# Patient Record
Sex: Female | Born: 1975 | Race: Black or African American | Hispanic: No | Marital: Married | Smoking: Never smoker
Health system: Southern US, Community
[De-identification: ages and names within clinical notes are randomized; demographics above are authoritative.]

---

## 1998-02-11 ENCOUNTER — Inpatient Hospital Stay (HOSPITAL_COMMUNITY): Admission: AD | Admit: 1998-02-11 | Discharge: 1998-02-13 | Payer: Self-pay | Admitting: Obstetrics and Gynecology

## 1998-03-23 ENCOUNTER — Encounter: Admission: RE | Admit: 1998-03-23 | Discharge: 1998-06-21 | Payer: Self-pay | Admitting: Obstetrics and Gynecology

## 1998-03-27 ENCOUNTER — Other Ambulatory Visit: Admission: RE | Admit: 1998-03-27 | Discharge: 1998-03-27 | Payer: Self-pay | Admitting: Obstetrics and Gynecology

## 1998-06-26 ENCOUNTER — Encounter (HOSPITAL_COMMUNITY): Admission: RE | Admit: 1998-06-26 | Discharge: 1998-09-24 | Payer: Self-pay | Admitting: *Deleted

## 1998-10-22 ENCOUNTER — Encounter (HOSPITAL_COMMUNITY): Admission: RE | Admit: 1998-10-22 | Discharge: 1999-01-20 | Payer: Self-pay | Admitting: *Deleted

## 1998-10-27 ENCOUNTER — Other Ambulatory Visit: Admission: RE | Admit: 1998-10-27 | Discharge: 1998-10-27 | Payer: Self-pay | Admitting: Obstetrics and Gynecology

## 1999-01-24 ENCOUNTER — Encounter (HOSPITAL_COMMUNITY): Admission: RE | Admit: 1999-01-24 | Discharge: 1999-04-24 | Payer: Self-pay | Admitting: *Deleted

## 1999-04-18 ENCOUNTER — Other Ambulatory Visit: Admission: RE | Admit: 1999-04-18 | Discharge: 1999-04-18 | Payer: Self-pay | Admitting: Obstetrics and Gynecology

## 2000-05-22 ENCOUNTER — Emergency Department (HOSPITAL_COMMUNITY): Admission: EM | Admit: 2000-05-22 | Discharge: 2000-05-22 | Payer: Self-pay | Admitting: Emergency Medicine

## 2000-11-07 ENCOUNTER — Other Ambulatory Visit: Admission: RE | Admit: 2000-11-07 | Discharge: 2000-11-07 | Payer: Self-pay | Admitting: Obstetrics and Gynecology

## 2001-11-17 ENCOUNTER — Other Ambulatory Visit: Admission: RE | Admit: 2001-11-17 | Discharge: 2001-11-17 | Payer: Self-pay | Admitting: Obstetrics and Gynecology

## 2003-02-10 ENCOUNTER — Other Ambulatory Visit: Admission: RE | Admit: 2003-02-10 | Discharge: 2003-02-10 | Payer: Self-pay | Admitting: Obstetrics and Gynecology

## 2003-05-20 ENCOUNTER — Ambulatory Visit (HOSPITAL_COMMUNITY): Admission: RE | Admit: 2003-05-20 | Discharge: 2003-05-20 | Payer: Self-pay | Admitting: Internal Medicine

## 2003-05-20 ENCOUNTER — Encounter: Payer: Self-pay | Admitting: Internal Medicine

## 2003-05-26 ENCOUNTER — Encounter: Admission: RE | Admit: 2003-05-26 | Discharge: 2003-08-24 | Payer: Self-pay | Admitting: Family Medicine

## 2004-02-14 ENCOUNTER — Other Ambulatory Visit: Admission: RE | Admit: 2004-02-14 | Discharge: 2004-02-14 | Payer: Self-pay | Admitting: Obstetrics and Gynecology

## 2005-05-01 ENCOUNTER — Other Ambulatory Visit: Admission: RE | Admit: 2005-05-01 | Discharge: 2005-05-01 | Payer: Self-pay | Admitting: Obstetrics and Gynecology

## 2005-07-02 ENCOUNTER — Ambulatory Visit: Payer: Self-pay | Admitting: Internal Medicine

## 2005-07-19 ENCOUNTER — Ambulatory Visit (HOSPITAL_COMMUNITY): Admission: RE | Admit: 2005-07-19 | Discharge: 2005-07-19 | Payer: Self-pay | Admitting: Internal Medicine

## 2005-07-19 ENCOUNTER — Ambulatory Visit: Payer: Self-pay | Admitting: Internal Medicine

## 2006-11-04 ENCOUNTER — Encounter: Admission: RE | Admit: 2006-11-04 | Discharge: 2006-11-04 | Payer: Self-pay | Admitting: *Deleted

## 2008-07-24 ENCOUNTER — Emergency Department (HOSPITAL_COMMUNITY): Admission: EM | Admit: 2008-07-24 | Discharge: 2008-07-25 | Payer: Self-pay | Admitting: Emergency Medicine

## 2008-10-24 ENCOUNTER — Inpatient Hospital Stay (HOSPITAL_COMMUNITY): Admission: AD | Admit: 2008-10-24 | Discharge: 2008-10-24 | Payer: Self-pay | Admitting: Obstetrics and Gynecology

## 2008-10-27 ENCOUNTER — Ambulatory Visit: Admission: RE | Admit: 2008-10-27 | Discharge: 2008-10-27 | Payer: Self-pay | Admitting: Obstetrics and Gynecology

## 2008-10-27 ENCOUNTER — Encounter (HOSPITAL_COMMUNITY): Payer: Self-pay | Admitting: Obstetrics and Gynecology

## 2008-10-27 ENCOUNTER — Ambulatory Visit: Payer: Self-pay | Admitting: Vascular Surgery

## 2008-12-28 ENCOUNTER — Inpatient Hospital Stay (HOSPITAL_COMMUNITY): Admission: AD | Admit: 2008-12-28 | Discharge: 2008-12-31 | Payer: Self-pay | Admitting: Obstetrics and Gynecology

## 2010-03-30 ENCOUNTER — Ambulatory Visit (HOSPITAL_COMMUNITY): Admission: RE | Admit: 2010-03-30 | Discharge: 2010-03-30 | Payer: Self-pay | Admitting: Obstetrics and Gynecology

## 2010-11-05 LAB — CBC
HCT: 30.5 % — ABNORMAL LOW (ref 36.0–46.0)
HCT: 35.6 % — ABNORMAL LOW (ref 36.0–46.0)
Hemoglobin: 10.9 g/dL — ABNORMAL LOW (ref 12.0–15.0)
Hemoglobin: 12.4 g/dL (ref 12.0–15.0)
MCHC: 34.8 g/dL (ref 30.0–36.0)
MCHC: 35.5 g/dL (ref 30.0–36.0)
MCV: 92.5 fL (ref 78.0–100.0)
MCV: 92.7 fL (ref 78.0–100.0)
Platelets: 244 10*3/uL (ref 150–400)
Platelets: 246 10*3/uL (ref 150–400)
RBC: 3.3 MIL/uL — ABNORMAL LOW (ref 3.87–5.11)
RBC: 3.84 MIL/uL — ABNORMAL LOW (ref 3.87–5.11)
RDW: 14.9 % (ref 11.5–15.5)
RDW: 14.9 % (ref 11.5–15.5)
WBC: 10.3 10*3/uL (ref 4.0–10.5)
WBC: 10.9 10*3/uL — ABNORMAL HIGH (ref 4.0–10.5)

## 2010-11-05 LAB — RPR: RPR Ser Ql: NONREACTIVE

## 2010-11-08 LAB — CBC
HCT: 34.3 % — ABNORMAL LOW (ref 36.0–46.0)
Hemoglobin: 11.8 g/dL — ABNORMAL LOW (ref 12.0–15.0)
MCHC: 34.3 g/dL (ref 30.0–36.0)
Platelets: 215 10*3/uL (ref 150–400)
RDW: 14.3 % (ref 11.5–15.5)

## 2010-11-08 LAB — COMPREHENSIVE METABOLIC PANEL
ALT: 16 U/L (ref 0–35)
AST: 23 U/L (ref 0–37)
Albumin: 2.7 g/dL — ABNORMAL LOW (ref 3.5–5.2)
Alkaline Phosphatase: 57 U/L (ref 39–117)
CO2: 21 mEq/L (ref 19–32)
Chloride: 103 mEq/L (ref 96–112)
Creatinine, Ser: 0.47 mg/dL (ref 0.4–1.2)
GFR calc non Af Amer: >60 mL/min (ref >60)
Potassium: 4 mEq/L (ref 3.5–5.1)
Sodium: 132 mEq/L — ABNORMAL LOW (ref 135–145)
Total Bilirubin: 0.3 mg/dL (ref 0.3–1.2)

## 2010-11-08 LAB — URINALYSIS, ROUTINE W REFLEX MICROSCOPIC
Bilirubin Urine: NEGATIVE
Glucose, UA: NEGATIVE mg/dL
Ketones, ur: NEGATIVE mg/dL
Nitrite: NEGATIVE
Protein, ur: NEGATIVE mg/dL
pH: 6.5 (ref 5.0–8.0)

## 2011-03-06 ENCOUNTER — Emergency Department (HOSPITAL_COMMUNITY)
Admission: EM | Admit: 2011-03-06 | Discharge: 2011-03-06 | Disposition: A | Discharge status: Home / Self Care | Payer: PRIVATE HEALTH INSURANCE | Attending: Emergency Medicine | Admitting: Emergency Medicine

## 2011-03-06 DIAGNOSIS — K089 Disorder of teeth and supporting structures, unspecified: Secondary | ICD-10-CM | POA: Insufficient documentation

## 2011-03-06 DIAGNOSIS — K029 Dental caries, unspecified: Secondary | ICD-10-CM | POA: Insufficient documentation

## 2011-03-15 IMAGING — RF DG HYSTEROGRAM
6 series · 6 of 6 positions shown · IV contrast (omnipaque)
Comparison: none

CLINICAL DATA: Post Essure evaluation

HYSTEROSALPINGOGRAM
TECHNIQUE: Following cleansing of the cervix and vagina with
Betadine solution, a hysterosalpingogram was performed using a 5-
French hysterosalpingogram catheter and Omnipaque 300 contrast.
The patient tolerated the examination without difficulty.
Fluoroscopy time: 0.9 minutes.

[Series 1: run · 1 of 1 slices shown (1 of 6)]
[im 1/1]
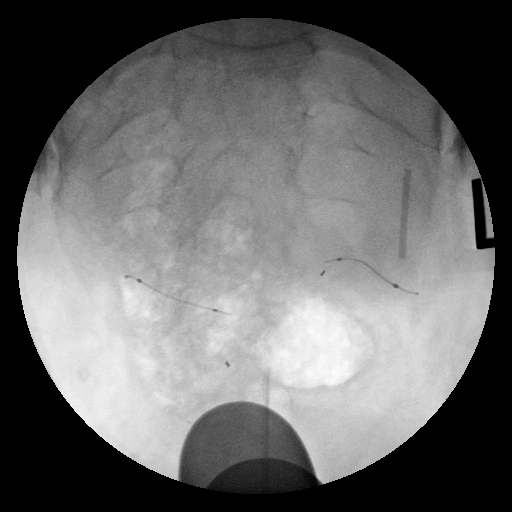

[Series 2: run · 1 of 1 slices shown (2 of 6)]
[im 1/1]
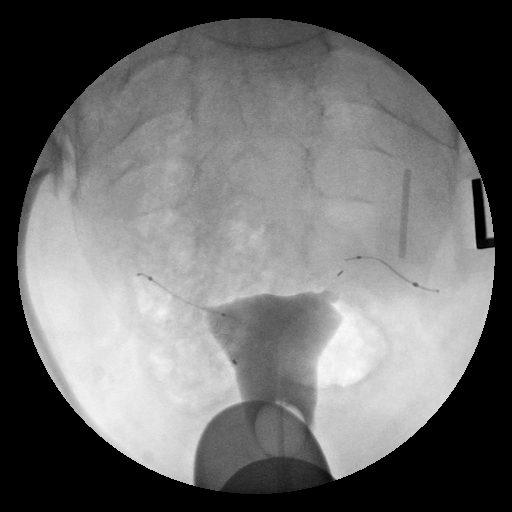

[Series 3: run · 1 of 1 slices shown (3 of 6)]
[im 1/1]
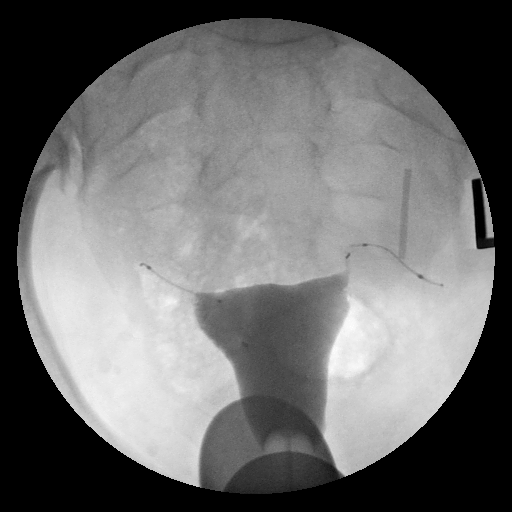

[Series 4: run · 1 of 1 slices shown (4 of 6)]
[im 1/1]
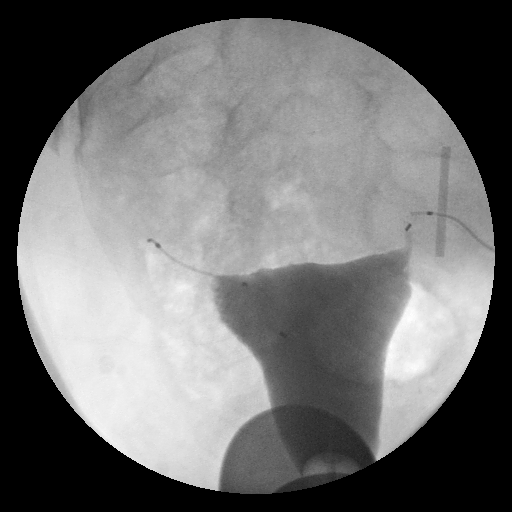

[Series 5: run · 1 of 1 slices shown (5 of 6)]
[im 1/1]
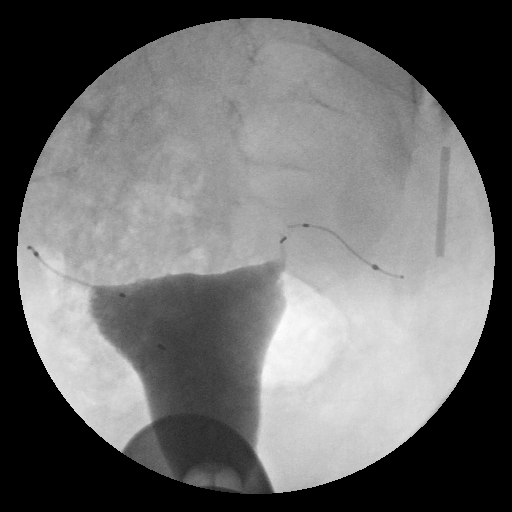

[Series 6: run · 1 of 1 slices shown (6 of 6)]
[im 1/1]
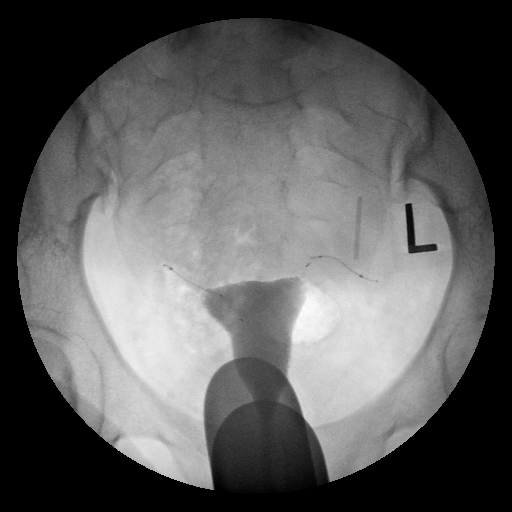

[6 of 6 positions shown; findings below may reference images not displayed]

FINDINGS: The endometrial morphology is within normal limits and
slightly patulous compatible with the multiparous status.

Both Essure implants are identified in appropriate position with
the right implant having a trailing edge into the uterine cavity
and the left implant with the proximal portion located just beyond
the cornual-tubal junction.  No evidence for contrast is seen
beyond the cornual-tubal junction on either side compatible
radiographically with tubal occlusion.
IMPRESSION: Appropriate implant positioning bilaterally.  Bilateral tubal
occlusion noted.

## 2013-06-03 ENCOUNTER — Encounter: Payer: Self-pay | Admitting: Podiatry

## 2013-06-03 ENCOUNTER — Ambulatory Visit (INDEPENDENT_AMBULATORY_CARE_PROVIDER_SITE_OTHER): Payer: BC Managed Care – PPO

## 2013-06-03 ENCOUNTER — Ambulatory Visit (INDEPENDENT_AMBULATORY_CARE_PROVIDER_SITE_OTHER): Payer: BC Managed Care – PPO | Admitting: Podiatry

## 2013-06-03 VITALS — Ht 70.0 in | Wt 253.0 lb

## 2013-06-03 DIAGNOSIS — R52 Pain, unspecified: Secondary | ICD-10-CM

## 2013-06-03 DIAGNOSIS — M7750 Other enthesopathy of unspecified foot: Secondary | ICD-10-CM

## 2013-06-03 MED ORDER — TRIAMCINOLONE ACETONIDE 10 MG/ML IJ SUSP
5.0000 mg | Freq: Once | INTRAMUSCULAR | Status: AC
  Administered 2013-06-03: 5 mg via INTRA_ARTICULAR

## 2013-06-03 NOTE — Progress Notes (Signed)
Subjective:     Patient ID: Kristin Gutierrez, female   DOB: 06-09-1976, 37 y.o.   MRN: [id number]  Foot Pain   patient presents stating I am having a lot of pain in the outside of my right foot. States it has been present for about 6 months and gets sore with ambulation   Review of Systems  All other systems reviewed and are negative.       Objective:   Physical Exam  Constitutional: She is oriented to person, place, and time. She appears well-developed.  Cardiovascular: Intact distal pulses.   Musculoskeletal: Normal range of motion.  Neurological: She is oriented to person, place, and time.  Skin: Skin is warm.   neurovascular status intact and muscle strength found to be adequate. At the peroneal brevis insertion there is exquisite discomfort when palpated to this area     Assessment:     Tendinitis right peroneal brevis insertion    Plan:     H&P and x-ray reviewed with patient. Sheath injection 3 mg Kenalog 5 mg Xylocaine Marcaine mixture accomplished and fascial brace dispensed with instructions

## 2013-06-03 NOTE — Progress Notes (Signed)
N- SORE, SWOLLEN  L- RT FOOT D SINCE MAY O SLOWLY C WORSE A WALKING T N/A

## 2013-06-14 ENCOUNTER — Encounter: Payer: Self-pay | Admitting: Podiatry

## 2013-06-14 ENCOUNTER — Ambulatory Visit: Payer: BC Managed Care – PPO | Admitting: Podiatry

## 2013-06-14 ENCOUNTER — Ambulatory Visit (INDEPENDENT_AMBULATORY_CARE_PROVIDER_SITE_OTHER): Payer: BC Managed Care – PPO | Admitting: Podiatry

## 2013-06-14 VITALS — BP 125/77 | HR 70 | Resp 16 | Ht 69.0 in | Wt 253.0 lb

## 2013-06-14 DIAGNOSIS — M7750 Other enthesopathy of unspecified foot: Secondary | ICD-10-CM

## 2013-06-16 NOTE — Progress Notes (Signed)
Subjective:     Patient ID: Kristin Gutierrez, female   DOB: Mar 14, 1976, 36 y.o.   MRN: [id number]  HPI patient states it's feeling some better but still mild discomfort noted   Review of Systems     Objective:   Physical Exam upon palpation the pain has reduced by about 80% lateral side right foot    neurovascular status intact with patient found to be well oriented Assessment:     Improved tendinitis right    Plan:     Advised on physical therapy supportive shoe gear and ice if symptoms recur reappoint

## 2013-08-24 ENCOUNTER — Other Ambulatory Visit: Payer: Self-pay | Admitting: Gastroenterology

## 2013-08-24 DIAGNOSIS — R110 Nausea: Secondary | ICD-10-CM

## 2013-08-27 ENCOUNTER — Ambulatory Visit
Admission: RE | Admit: 2013-08-27 | Discharge: 2013-08-27 | Disposition: A | Discharge status: Home / Self Care | Payer: BC Managed Care – PPO | Source: Ambulatory Visit | Attending: Gastroenterology | Admitting: Gastroenterology

## 2013-08-27 DIAGNOSIS — R110 Nausea: Secondary | ICD-10-CM

## 2013-09-30 ENCOUNTER — Encounter: Payer: Self-pay | Admitting: Podiatry

## 2013-09-30 ENCOUNTER — Ambulatory Visit (INDEPENDENT_AMBULATORY_CARE_PROVIDER_SITE_OTHER): Payer: BC Managed Care – PPO | Admitting: Podiatry

## 2013-09-30 VITALS — BP 127/82 | HR 87 | Resp 16

## 2013-09-30 DIAGNOSIS — M7750 Other enthesopathy of unspecified foot: Secondary | ICD-10-CM

## 2013-09-30 MED ORDER — TRIAMCINOLONE ACETONIDE 10 MG/ML IJ SUSP
10.0000 mg | Freq: Once | INTRAMUSCULAR | Status: AC
  Administered 2013-09-30: 10 mg

## 2013-09-30 NOTE — Progress Notes (Signed)
Subjective:     Patient ID: Kristin Gutierrez, female   DOB: 08/25/1975, 38 y.o.   MRN: [id number]  HPI patient presents stating I'm having pain in the outside of my right foot that has returned in the last few weeks. I am and nerves and need to be active and it is becoming increasingly hard for me to do with the return of this pain   Review of Systems     Objective:   Physical Exam Neurovascular status intact with patient well oriented x3 and discomfort in the peroneal tendon as it inserts into the base of the fifth metatarsal right with mild edema around the area but no indications of current tear of the tendon and tendon found to be strong when tested from a muscle strength perspective    Assessment:     Peroneal brevis tendon inflammation right with no current indication of tear    Plan:     Explained to the patient this issue and at this time the careful sheath injection 3 mg Kenalog 5 mg Xylocaine Marcaine mixture and applied air fracture walker to immobilize the lateral tendon. Then went ahead and scanned for custom orthotics with valgus wedges to try to reduce lateral stress

## 2013-10-05 ENCOUNTER — Telehealth: Payer: Self-pay | Admitting: *Deleted

## 2013-10-05 NOTE — Telephone Encounter (Signed)
Pt states she left a message at 829am this morning, I did get a message, the pt spoke quickly and was difficult to understand.  I made multiple attempts at deciphering the name and date of birth and phone number, but it was unclear.  I was able to understand she had nerve pain in her big toe due to the "air boot".  Pt called again and gave the time she called and I was able to identify pt by the time initially called and the problem listed.  I left a message instructedpt to release the air in the Air Fracture Walker by turning the knob beneath the gray bubble counter-clockwise, then reinflate by turning the knob clockwise and pumping the bubble up until snug, but not tight.  I also instructed the pt to cross the last 2 straps and that changes the pressure the boot put on the toes.  Pt left a message states is having numbness in the top of her foot that goes into her toe.  I called pt, she states the original problem is better in the boot, but could she be doing something with the boot that is causing the new problem.  I told pt to try the tips I gave and call tomorrow and I would see her if she needed.  Pt agreed.

## 2013-10-14 ENCOUNTER — Other Ambulatory Visit: Payer: BC Managed Care – PPO

## 2013-10-18 ENCOUNTER — Ambulatory Visit (INDEPENDENT_AMBULATORY_CARE_PROVIDER_SITE_OTHER): Payer: BC Managed Care – PPO | Admitting: *Deleted

## 2013-10-18 DIAGNOSIS — R52 Pain, unspecified: Secondary | ICD-10-CM

## 2013-10-18 NOTE — Progress Notes (Signed)
   Subjective:    Patient ID: Kristin Gutierrez, female    DOB: 02/11/1976, 38 y.o.   MRN: [id number]  HPI RE-SCANNED FOR ORTHOTICS    Review of Systems     Objective:   Physical Exam        Assessment & Plan:

## 2013-11-08 ENCOUNTER — Telehealth: Payer: Self-pay | Admitting: *Deleted

## 2013-11-08 ENCOUNTER — Other Ambulatory Visit: Payer: BC Managed Care – PPO

## 2013-11-08 NOTE — Telephone Encounter (Signed)
Patient came by to pick up her orthotics, instructions were given.

## 2013-12-27 ENCOUNTER — Ambulatory Visit: Payer: BC Managed Care – PPO | Admitting: Podiatry

## 2014-08-12 IMAGING — US US ABDOMEN COMPLETE
1 series · 14 of 25 positions shown · non-contrast
Comparison: None.

CLINICAL DATA: Nausea with diarrhea for 3 weeks. History of Essure
implant.

EXAM:
ULTRASOUND ABDOMEN COMPLETE

[Series 1: us abdomen complete · 0.39mm/px · 14 of 71 slices shown]
[im 1/71]
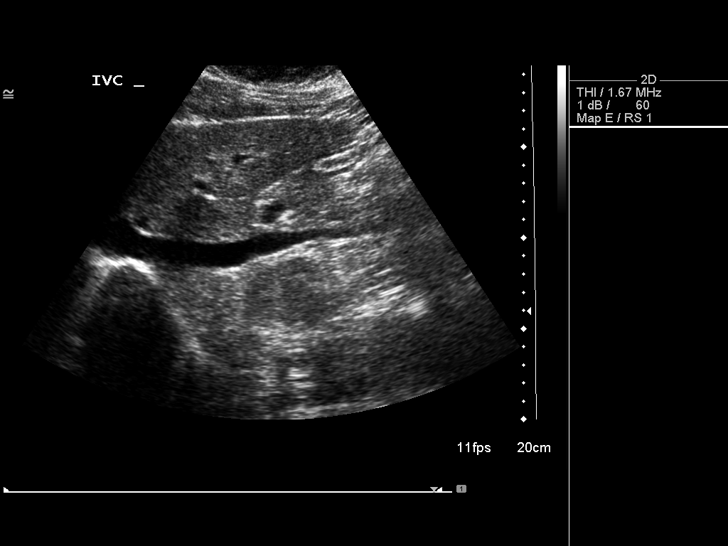
[im 6/71]
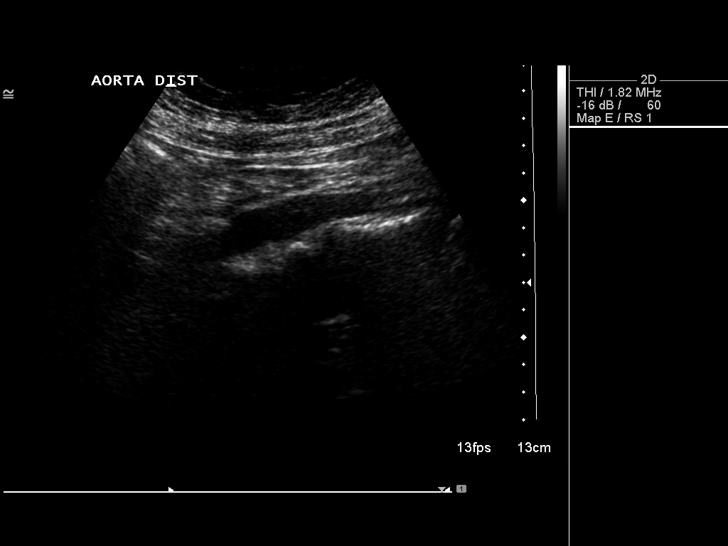
[im 12/71]
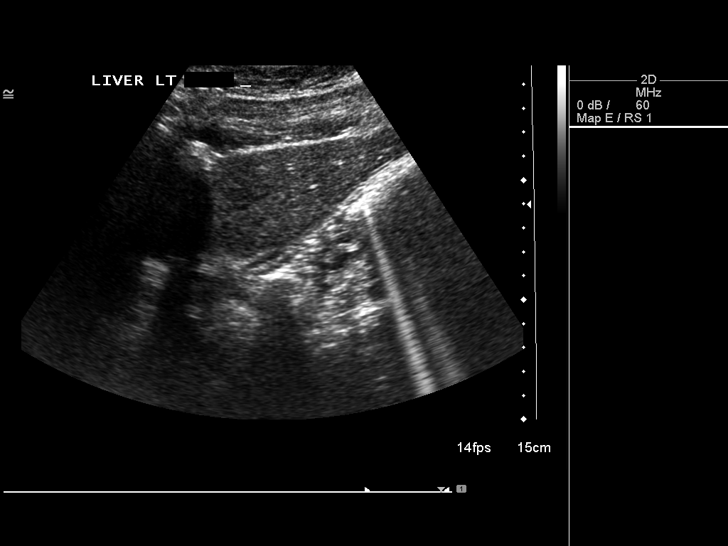
[im 18/71]
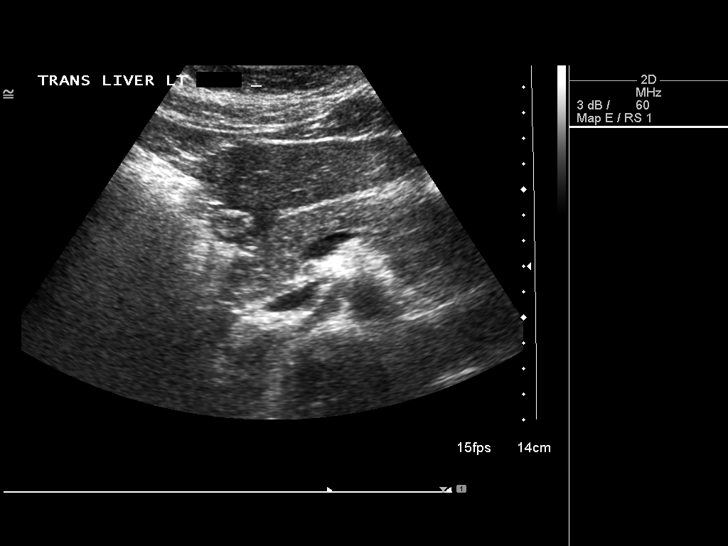
[im 24/71]
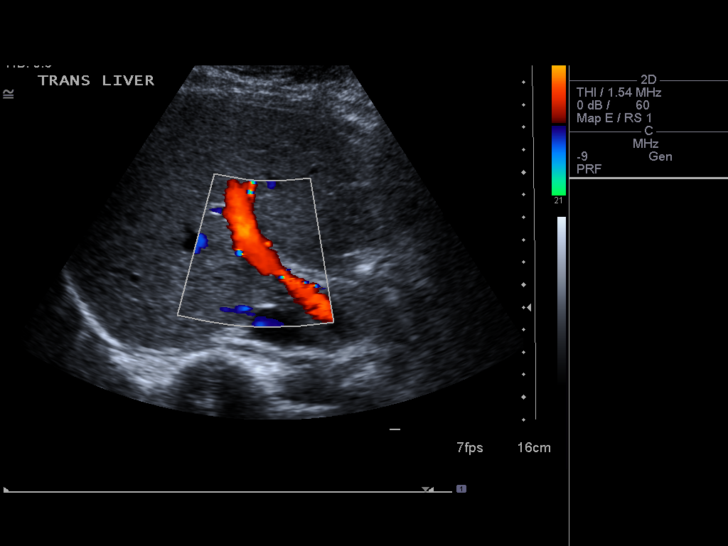
[im 27/71]
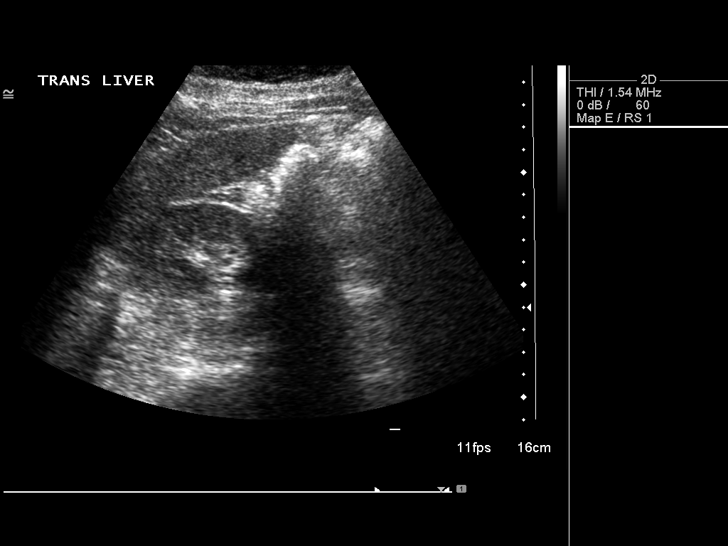
[im 33/71]
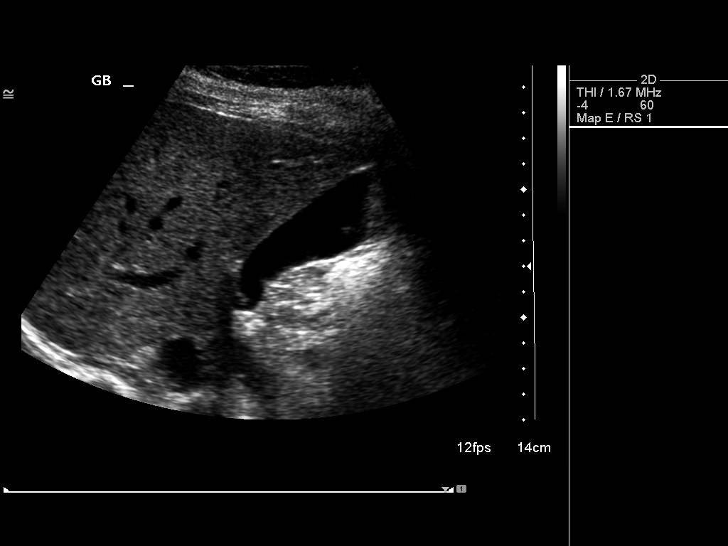
[im 38/71]
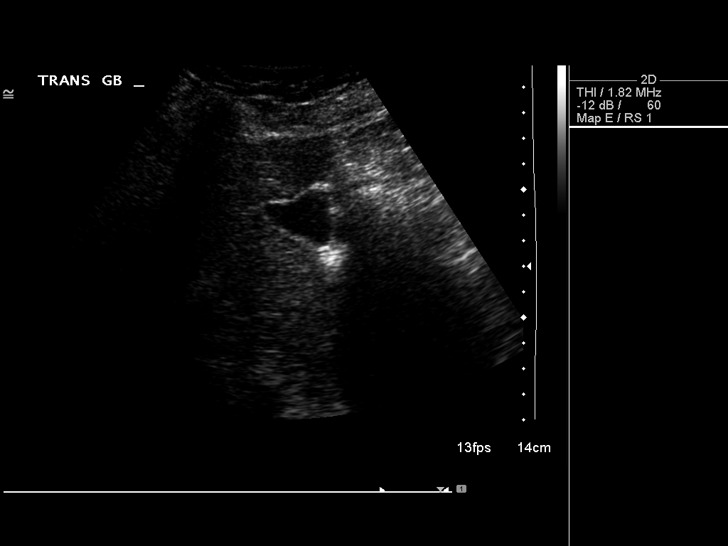
[im 44/71]
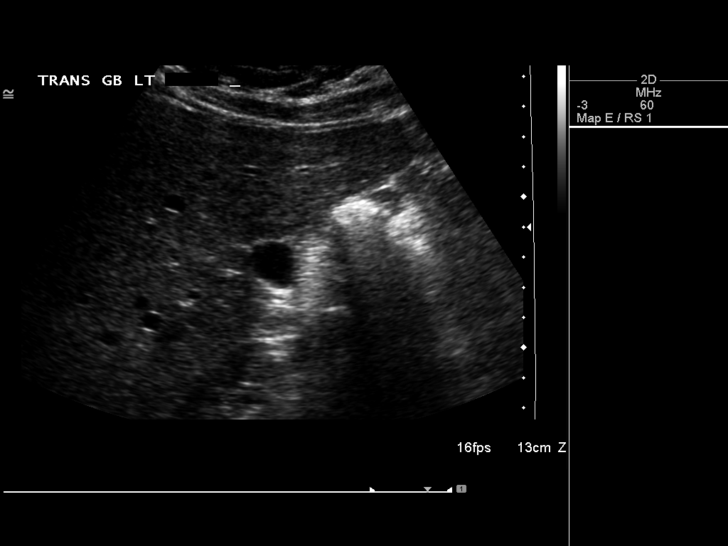
[im 47/71]
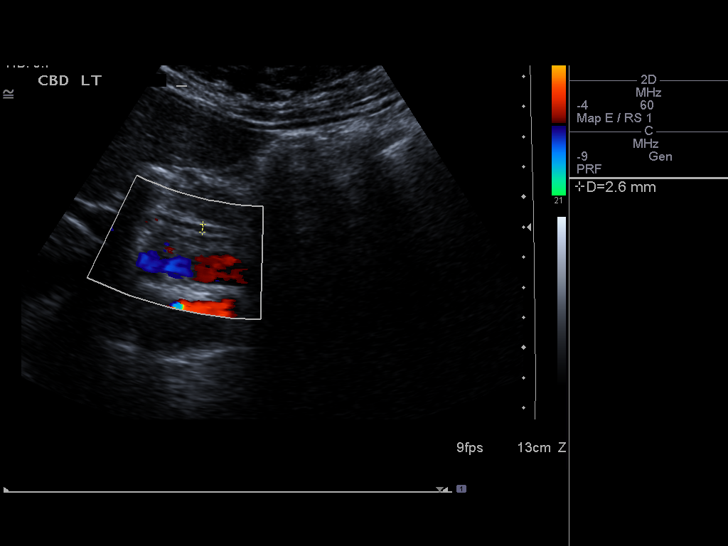
[im 53/71]
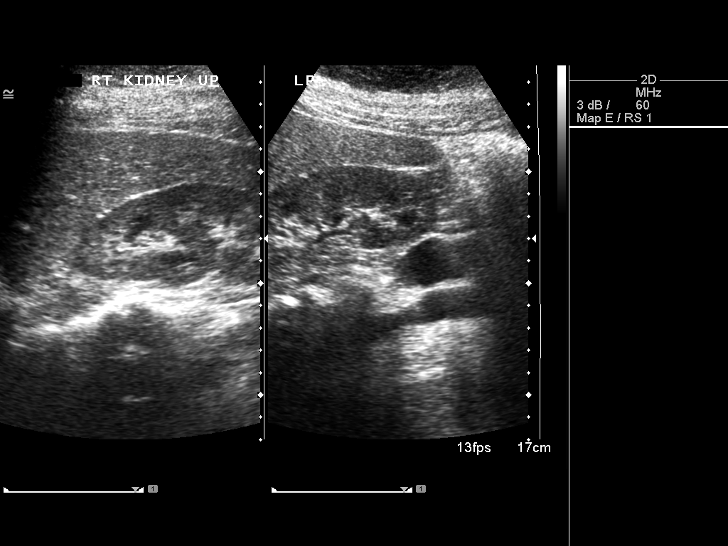
[im 59/71]
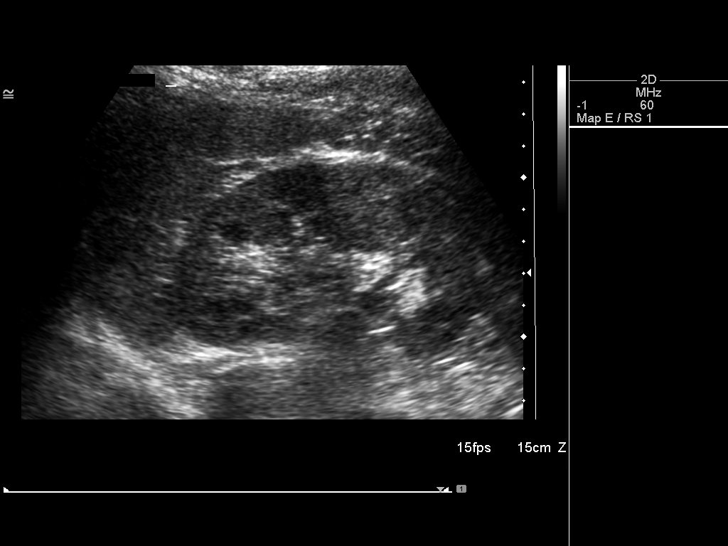
[im 65/71]
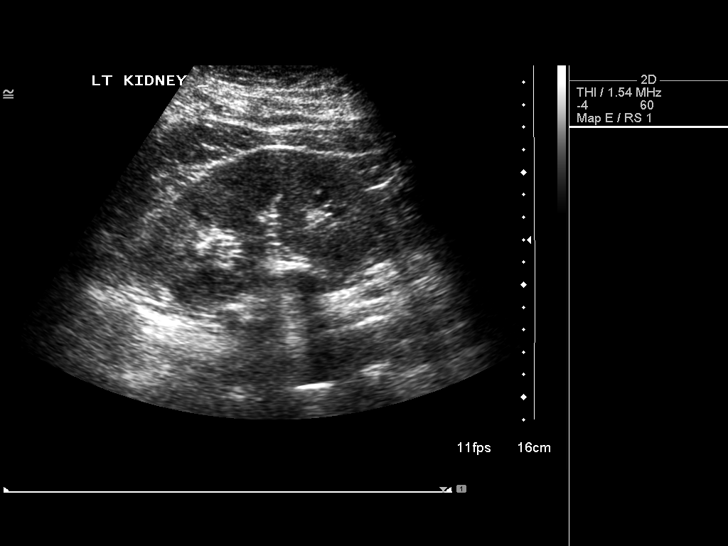
[im 71/71]
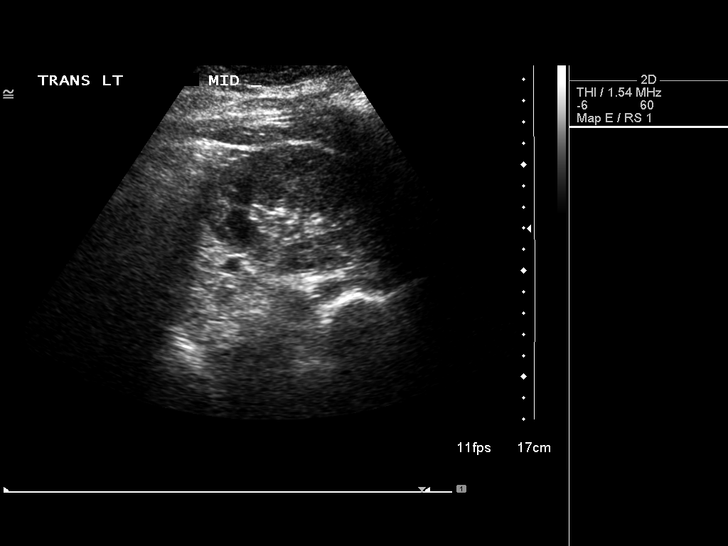

[14 of 25 positions shown; findings below may reference images not displayed]

FINDINGS: Gallbladder:

No gallstones or wall thickening visualized. No sonographic Murphy
sign noted.

Common bile duct:

Diameter: 2.6 mm.  No evidence of choledocholithiasis.

Liver:

No focal lesion identified. Within normal limits in parenchymal
echogenicity.

IVC:

No abnormality visualized.

Pancreas:

The visualized portions appear unremarkable. Portions of the
pancreatic head and tail are obscured by bowel gas.

Spleen:

Size and appearance within normal limits.

Right Kidney:

Length: 11.2 cm. Echogenicity within normal limits. No mass or
hydronephrosis visualized.

Left Kidney:

Length: 10.5 cm. Echogenicity within normal limits. No mass or
hydronephrosis visualized.

Abdominal aorta:

No aneurysm visualized.

Other findings:

None.
IMPRESSION: No acute or significant findings. Portions of the pancreas are
obscured by bowel gas.

## 2019-09-06 ENCOUNTER — Encounter: Payer: Self-pay | Admitting: Podiatry

## 2019-09-06 ENCOUNTER — Other Ambulatory Visit: Payer: Self-pay

## 2019-09-06 ENCOUNTER — Other Ambulatory Visit: Payer: Self-pay | Admitting: Podiatry

## 2019-09-06 ENCOUNTER — Ambulatory Visit (INDEPENDENT_AMBULATORY_CARE_PROVIDER_SITE_OTHER): Payer: BC Managed Care – PPO

## 2019-09-06 ENCOUNTER — Ambulatory Visit: Payer: BC Managed Care – PPO | Admitting: Podiatry

## 2019-09-06 VITALS — Temp 97.2°F

## 2019-09-06 DIAGNOSIS — M79672 Pain in left foot: Secondary | ICD-10-CM

## 2019-09-06 DIAGNOSIS — M79671 Pain in right foot: Secondary | ICD-10-CM

## 2019-09-06 DIAGNOSIS — M722 Plantar fascial fibromatosis: Secondary | ICD-10-CM

## 2019-09-06 MED ORDER — DICLOFENAC SODIUM 75 MG PO TBEC: 75.0000 mg | DELAYED_RELEASE_TABLET | Freq: Two times a day (BID) | ORAL | 2 refills | Status: DC

## 2019-09-06 NOTE — Patient Instructions (Signed)

## 2019-09-06 NOTE — Progress Notes (Signed)
Subjective:   Patient ID: Kristin Gutierrez, female   DOB: 44 y.o.   MRN: [id number]   HPI Patient presents with mid arch pain both feet of about a year.  Patient states she is trying to get more active and this makes it hard and states that her ankles pop at different times also.  Patient does not smoke likes to be active   Review of Systems  All other systems reviewed and are negative.       Objective:  Physical Exam Vitals and nursing note reviewed.  Constitutional:      Appearance: She is well-developed.  Pulmonary:     Effort: Pulmonary effort is normal.  Musculoskeletal:        General: Normal range of motion.  Skin:    General: Skin is warm.  Neurological:     Mental Status: She is alert.     Neurovascular status intact muscle strength adequate range of motion within normal limits.  Patient is noted to have discomfort in the mid arch area bilateral with inflammation fluid of the plantar fascia with no insertional pain noted.  Patient has good digital perfusion well oriented x3     Assessment:  Acute plantar fasciitis mid arch bilateral with inflammation fluid buildup     Plan:  H&P reviewed condition and x-rays and today I did sterile prep of both feet and injected the mid fascia 3 mg Kenalog 5 mg Xylocaine and I applied fascial brace bilateral.  Gave instructions on physical therapy and shoe gear modification and reappoint to recheck  X-rays indicate that there is mild depression of the arch no indications of arthritis or spur formation

## 2019-09-23 ENCOUNTER — Encounter: Payer: Self-pay | Admitting: Podiatry

## 2019-09-23 ENCOUNTER — Other Ambulatory Visit: Payer: Self-pay

## 2019-09-23 ENCOUNTER — Ambulatory Visit: Payer: BC Managed Care – PPO | Admitting: Podiatry

## 2019-09-23 VITALS — Temp 97.2°F

## 2019-09-23 DIAGNOSIS — M722 Plantar fascial fibromatosis: Secondary | ICD-10-CM

## 2019-09-24 NOTE — Progress Notes (Signed)
Subjective:   Patient ID: Kristin Gutierrez, female   DOB: 44 y.o.   MRN: [id number]   HPI Patient states overall improving with discomfort only upon deep palpation and states that she feels like the pain has receded quite a bit   ROS      Objective:  Physical Exam  Neurovascular status intact negative Denna Haggard' sign noted with patient's heel improving with mild discomfort only upon deep palpation     Assessment:  Doing well post treatment for tendinitis fasciitis     Plan:  H&P reviewed continuation of physical therapy anti-inflammatories heel lift usage and patient will be seen back as needed

## 2020-10-23 ENCOUNTER — Ambulatory Visit: Payer: BC Managed Care – PPO | Admitting: Podiatry

## 2020-10-23 ENCOUNTER — Ambulatory Visit (INDEPENDENT_AMBULATORY_CARE_PROVIDER_SITE_OTHER): Payer: BC Managed Care – PPO

## 2020-10-23 ENCOUNTER — Encounter: Payer: Self-pay | Admitting: Podiatry

## 2020-10-23 ENCOUNTER — Other Ambulatory Visit: Payer: Self-pay

## 2020-10-23 DIAGNOSIS — M722 Plantar fascial fibromatosis: Secondary | ICD-10-CM

## 2020-10-23 DIAGNOSIS — M7661 Achilles tendinitis, right leg: Secondary | ICD-10-CM

## 2020-10-23 MED ORDER — TRIAMCINOLONE ACETONIDE 10 MG/ML IJ SUSP
10.0000 mg | Freq: Once | INTRAMUSCULAR | Status: AC
  Administered 2020-10-23: 10 mg

## 2020-10-23 MED ORDER — DICLOFENAC SODIUM 75 MG PO TBEC: 75.0000 mg | DELAYED_RELEASE_TABLET | Freq: Two times a day (BID) | ORAL | 2 refills | Status: DC

## 2020-10-23 NOTE — Patient Instructions (Signed)

## 2020-10-23 NOTE — Progress Notes (Signed)
Subjective:   Patient ID: Kristin Gutierrez, female   DOB: 45 y.o.   MRN: [id number]   HPI Patient states that she has severe pain in the heel right over left and is mostly now in the back of the heel that is been driving her crazy.  States is been worse on the right for 6 months and patient states that she is working a different job and that work shoes seems to be contributory.  States it is become quite intense and is making it hard for her to walk   ROS      Objective:  Physical Exam  Neurovascular status found to be intact muscle strength adequate exquisite discomfort posterior aspect right heel at the insertion Achilles lateral side and into the central with no medial involvement with inflammation and also discomfort pain of the plantar fascial bilateral more chronic in nature with no intense discomfort plantarly     Assessment:  Probability for acute Achilles tendinitis right along with chronic plantar fasciitis bilateral     Plan:  H&P x-rays reviewed today I went ahead and I did first discussed injection and possibilities for rupture and she wants to go this route and I went ahead and I did a careful injection of the lateral side of the Achilles 3 mg Dexasone Kenalog 5 mg Xylocaine and I advised on ice therapy anti-inflammatories physical therapy.  Discussed long-term orthotics dispensed night splint with the usage of her ice and she was to be seen back 3 weeks and is placed on diclofenac oral medication  X-rays indicate posterior spur formation heel right over left with moderate elevation first metatarsal segment bilateral

## 2020-11-13 ENCOUNTER — Other Ambulatory Visit: Payer: Self-pay

## 2020-11-13 ENCOUNTER — Ambulatory Visit: Payer: BC Managed Care – PPO | Admitting: Podiatry

## 2020-11-13 ENCOUNTER — Encounter: Payer: Self-pay | Admitting: Podiatry

## 2020-11-13 DIAGNOSIS — M722 Plantar fascial fibromatosis: Secondary | ICD-10-CM

## 2020-11-13 DIAGNOSIS — M7661 Achilles tendinitis, right leg: Secondary | ICD-10-CM

## 2020-11-13 NOTE — Progress Notes (Signed)
Subjective:   Patient ID: Kristin Gutierrez, female   DOB: 44 y.o.   MRN: [id number]   HPI Patient states the back of her right heel is doing much better but still has some pain and states it hurts at times during the day and she also gets some pain in the bottom of her heel which has had chronic heel pain   ROS      Objective:  Physical Exam  Neurovascular status intact with improvement of discomfort posterior heel but pain still noted with no equinus no indications muscle strength loss with mild fascial inflammation     Assessment:  Combination Achilles tendinitis right along with fasciitis like symptomatology improved but present     Plan:  H&P reviewed condition and recommended immobilization to be used during the day and dispensed air fracture walker with night splint for night along with aggressive ice to be continued.  Hopefully this will be the end of the symptoms patient is encouraged to call with questions I also encouraged weight loss which I think would be beneficial and patient will be seen back as symptoms indicate

## 2021-03-12 ENCOUNTER — Other Ambulatory Visit: Payer: Self-pay

## 2021-03-12 ENCOUNTER — Ambulatory Visit: Payer: BC Managed Care – PPO | Admitting: Podiatry

## 2021-03-12 ENCOUNTER — Ambulatory Visit (INDEPENDENT_AMBULATORY_CARE_PROVIDER_SITE_OTHER): Payer: BC Managed Care – PPO

## 2021-03-12 DIAGNOSIS — M67972 Unspecified disorder of synovium and tendon, left ankle and foot: Secondary | ICD-10-CM | POA: Diagnosis not present

## 2021-03-12 DIAGNOSIS — M7661 Achilles tendinitis, right leg: Secondary | ICD-10-CM

## 2021-03-12 DIAGNOSIS — M7662 Achilles tendinitis, left leg: Secondary | ICD-10-CM | POA: Diagnosis not present

## 2021-03-12 MED ORDER — PREDNISONE 10 MG PO TABS: ORAL_TABLET | ORAL | 0 refills | Status: DC

## 2021-03-12 NOTE — Progress Notes (Signed)
Subjective:   Patient ID: Kristin Gutierrez, female   DOB: 45 y.o.   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  I would like to avoid surgery still on this patient and she totally agrees and at this point organ to start physical therapy I placed on a Sterapred DS 12-day Dosepak to be followed by Aleve along with aggressive ice and we will give it 4 weeks physical therapy and discussed possible PRP injections versus possibility for shockwave therapy

## 2021-06-01 ENCOUNTER — Ambulatory Visit: Payer: BC Managed Care – PPO | Admitting: Podiatry

## 2021-06-01 ENCOUNTER — Ambulatory Visit (INDEPENDENT_AMBULATORY_CARE_PROVIDER_SITE_OTHER): Payer: BC Managed Care – PPO

## 2021-06-01 ENCOUNTER — Other Ambulatory Visit: Payer: Self-pay

## 2021-06-01 ENCOUNTER — Encounter: Payer: Self-pay | Admitting: Podiatry

## 2021-06-01 DIAGNOSIS — M67972 Unspecified disorder of synovium and tendon, left ankle and foot: Secondary | ICD-10-CM | POA: Diagnosis not present

## 2021-06-01 DIAGNOSIS — M7661 Achilles tendinitis, right leg: Secondary | ICD-10-CM

## 2021-06-01 MED ORDER — NITROGLYCERIN 0.2 MG/HR TD PT24: 0.2000 mg | MEDICATED_PATCH | Freq: Every day | TRANSDERMAL | 12 refills | Status: AC

## 2021-06-04 NOTE — Progress Notes (Signed)
Subjective:   Patient ID: Kristin Gutierrez, female   DOB: 45 y.o.   MRN: [id number]   HPI Patient presents stating that she is getting a lot of pain mostly in the back of her right heel and that it is increased and is not changed despite the conservative treatments that we have been doing   ROS      Objective:  Physical Exam  Neurovascular status intact with exquisite discomfort posterior aspect right heel near the insertion to the calcaneus with moderate obesity is complicating factor     Assessment:  At Achilles tendinitis right over left with inflammation     Plan:  H&P reviewed condition discussed shockwave therapy or PRP injections and at this point organ to try nitro patches heel lifts and stretching with the consideration for shockwave that she will book herself.  May require ultimate other treatment and MRI but were desperately trying to avoid having to do that

## 2021-07-06 ENCOUNTER — Other Ambulatory Visit: Payer: BC Managed Care – PPO

## 2023-03-13 ENCOUNTER — Other Ambulatory Visit: Payer: Self-pay | Admitting: Obstetrics and Gynecology

## 2023-03-13 DIAGNOSIS — N644 Mastodynia: Secondary | ICD-10-CM

## 2023-03-25 ENCOUNTER — Other Ambulatory Visit: Payer: Self-pay
